# Patient Record
Sex: Female | Born: 1935 | Race: Black or African American | Hispanic: No | Marital: Married | Smoking: Never smoker
Health system: Southern US, Community
[De-identification: ages and names within clinical notes are randomized; demographics above are authoritative.]

## PROBLEM LIST (undated history)

## (undated) DIAGNOSIS — I10 Essential (primary) hypertension: Secondary | ICD-10-CM

## (undated) HISTORY — PX: TUBAL LIGATION: SHX77

---

## 1999-05-31 ENCOUNTER — Emergency Department (HOSPITAL_COMMUNITY): Admission: EM | Admit: 1999-05-31 | Discharge: 1999-05-31 | Payer: Self-pay | Admitting: Emergency Medicine

## 2000-04-10 ENCOUNTER — Encounter: Payer: Self-pay | Admitting: Internal Medicine

## 2000-04-10 ENCOUNTER — Encounter: Admission: RE | Admit: 2000-04-10 | Discharge: 2000-04-10 | Payer: Self-pay | Admitting: Internal Medicine

## 2000-05-29 ENCOUNTER — Other Ambulatory Visit: Admission: RE | Admit: 2000-05-29 | Discharge: 2000-05-29 | Payer: Self-pay | Admitting: Obstetrics & Gynecology

## 2000-05-29 ENCOUNTER — Encounter (INDEPENDENT_AMBULATORY_CARE_PROVIDER_SITE_OTHER): Payer: Self-pay

## 2000-06-03 ENCOUNTER — Encounter: Admission: RE | Admit: 2000-06-03 | Discharge: 2000-06-03 | Payer: Self-pay | Admitting: Internal Medicine

## 2000-06-03 ENCOUNTER — Encounter: Payer: Self-pay | Admitting: Internal Medicine

## 2003-02-07 ENCOUNTER — Other Ambulatory Visit: Admission: RE | Admit: 2003-02-07 | Discharge: 2003-02-07 | Payer: Self-pay | Admitting: Internal Medicine

## 2003-06-13 ENCOUNTER — Ambulatory Visit (HOSPITAL_COMMUNITY): Admission: RE | Admit: 2003-06-13 | Discharge: 2003-06-13 | Payer: Self-pay | Admitting: Internal Medicine

## 2005-03-26 ENCOUNTER — Encounter: Admission: RE | Admit: 2005-03-26 | Discharge: 2005-03-26 | Payer: Self-pay | Admitting: Internal Medicine

## 2006-01-29 ENCOUNTER — Emergency Department (HOSPITAL_COMMUNITY): Admission: EM | Admit: 2006-01-29 | Discharge: 2006-01-29 | Payer: Self-pay | Admitting: Emergency Medicine

## 2007-05-27 ENCOUNTER — Emergency Department (HOSPITAL_COMMUNITY): Admission: EM | Admit: 2007-05-27 | Discharge: 2007-05-27 | Payer: Self-pay | Admitting: Emergency Medicine

## 2007-09-29 ENCOUNTER — Encounter: Admission: RE | Admit: 2007-09-29 | Discharge: 2007-09-29 | Payer: Self-pay | Admitting: Internal Medicine

## 2010-12-21 LAB — BASIC METABOLIC PANEL
BUN: 4 — ABNORMAL LOW
CO2: 30
Calcium: 9.4
Chloride: 99
Creatinine, Ser: 1.04
GFR calc Af Amer: >60

## 2010-12-21 LAB — URINALYSIS, ROUTINE W REFLEX MICROSCOPIC
Glucose, UA: NEGATIVE
Hgb urine dipstick: NEGATIVE
Ketones, ur: NEGATIVE
Protein, ur: NEGATIVE
Urobilinogen, UA: 0.2

## 2010-12-21 LAB — CBC
MCHC: 34.7
MCV: 86.7
Platelets: 154
RBC: 4.49

## 2010-12-21 LAB — DIFFERENTIAL
Basophils Absolute: 0
Basophils Relative: 0
Eosinophils Absolute: 0
Neutro Abs: 4.9
Neutrophils Relative %: 91 — ABNORMAL HIGH

## 2011-08-29 ENCOUNTER — Ambulatory Visit
Admission: RE | Admit: 2011-08-29 | Discharge: 2011-08-29 | Disposition: A | Discharge status: Home / Self Care | Payer: Medicare Other | Source: Ambulatory Visit | Attending: Orthopedic Surgery | Admitting: Orthopedic Surgery

## 2011-08-29 ENCOUNTER — Other Ambulatory Visit: Payer: Self-pay | Admitting: Orthopedic Surgery

## 2011-08-29 DIAGNOSIS — R52 Pain, unspecified: Secondary | ICD-10-CM

## 2011-08-29 DIAGNOSIS — M5412 Radiculopathy, cervical region: Secondary | ICD-10-CM

## 2012-08-13 ENCOUNTER — Encounter (HOSPITAL_COMMUNITY): Payer: Self-pay | Admitting: Emergency Medicine

## 2012-08-13 ENCOUNTER — Emergency Department (HOSPITAL_COMMUNITY): Payer: Medicare Other

## 2012-08-13 ENCOUNTER — Emergency Department (HOSPITAL_COMMUNITY)
Admission: EM | Admit: 2012-08-13 | Discharge: 2012-08-13 | Disposition: A | Discharge status: Home / Self Care | Payer: Medicare Other | Attending: Emergency Medicine | Admitting: Emergency Medicine

## 2012-08-13 DIAGNOSIS — Z79899 Other long term (current) drug therapy: Secondary | ICD-10-CM | POA: Insufficient documentation

## 2012-08-13 DIAGNOSIS — W010XXA Fall on same level from slipping, tripping and stumbling without subsequent striking against object, initial encounter: Secondary | ICD-10-CM | POA: Insufficient documentation

## 2012-08-13 DIAGNOSIS — S8392XA Sprain of unspecified site of left knee, initial encounter: Secondary | ICD-10-CM

## 2012-08-13 DIAGNOSIS — Y939 Activity, unspecified: Secondary | ICD-10-CM | POA: Insufficient documentation

## 2012-08-13 DIAGNOSIS — Y9289 Other specified places as the place of occurrence of the external cause: Secondary | ICD-10-CM | POA: Insufficient documentation

## 2012-08-13 DIAGNOSIS — IMO0002 Reserved for concepts with insufficient information to code with codable children: Secondary | ICD-10-CM | POA: Insufficient documentation

## 2012-08-13 DIAGNOSIS — I10 Essential (primary) hypertension: Secondary | ICD-10-CM | POA: Insufficient documentation

## 2012-08-13 HISTORY — DX: Essential (primary) hypertension: I10

## 2012-08-13 MED ORDER — TRAMADOL HCL 50 MG PO TABS: 50.0000 mg | ORAL_TABLET | Freq: Four times a day (QID) | ORAL | Status: DC | PRN

## 2012-08-13 NOTE — ED Provider Notes (Signed)
History     CSN: [id number]  Arrival date & time 08/13/12  7846   First MD Initiated Contact with Patient 08/13/12 1947      Chief Complaint  Patient presents with  . Knee Pain    (Consider location/radiation/quality/duration/timing/severity/associated sxs/prior treatment) HPI Comments: Patient presents to the ER for evaluation of left knee injury. Patient reports that she slipped on the wet floor earlier today and fell. She twisted her left knee. She has been having pain in the knee since the fall. She has been icing it and there is no swelling. She is able to walk, but it hurts when she puts weight on it. No other injury. Did no hit her head.  Patient is a 77 y.o. female presenting with knee pain.  Knee Pain Associated symptoms: no back pain and no neck pain     Past Medical History  Diagnosis Date  . Hypertension     Past Surgical History  Procedure Laterality Date  . Tubal ligation      Family History  Problem Relation Age of Onset  . Hypertension Mother   . Diabetes Mother   . Diabetes Sister     History  Substance Use Topics  . Smoking status: Never Smoker   . Smokeless tobacco: Not on file  . Alcohol Use: No    OB History   Grav Para Term Preterm Abortions TAB SAB Ect Mult Living                  Review of Systems  HENT: Negative for neck pain.   Musculoskeletal: Positive for arthralgias. Negative for back pain.    Allergies  Penicillins  Home Medications   Current Outpatient Rx  Name  Route  Sig  Dispense  Refill  . ibuprofen (ADVIL,MOTRIN) 200 MG tablet   Oral   Take 200 mg by mouth every 6 (six) hours as needed for pain.         Marland Kitchen triamterene-hydrochlorothiazide (MAXZIDE-25) 37.5-25 MG per tablet   Oral   Take 1 tablet by mouth daily.           BP 152/68  Pulse 50  Temp(Src) 98.8 F (37.1 C) (Oral)  Resp 16  SpO2 100%  Physical Exam  Constitutional: She appears well-developed and well-nourished.  HENT:  Head:  Normocephalic.  Eyes: Pupils are equal, round, and reactive to light.  Neck: Normal range of motion. No spinous process tenderness and no muscular tenderness present.  Musculoskeletal: Normal range of motion.       Left knee: She exhibits normal range of motion, no swelling, no effusion, no ecchymosis, no deformity, no laceration, no erythema, normal alignment, no LCL laxity, normal patellar mobility and no MCL laxity. Tenderness found.       Cervical back: Normal.       Thoracic back: Normal.       Lumbar back: Normal.    ED Course  Procedures (including critical care time)  Labs Reviewed - No data to display Dg Knee Complete 4 Views Left  08/13/2012   *RADIOLOGY REPORT*  Clinical Data: Fall, left knee pain.  LEFT KNEE - COMPLETE 4+ VIEW  Comparison: None.  Findings: There is no acute bony or joint abnormality.  No joint effusion.  The patient has some degenerative change about the knee most notable in the medial compartment.  Enthesopathic change quadriceps tendon insertion noted.  IMPRESSION: No acute finding.   Original Report Authenticated By: Holley Dexter, M.D.  Diagnosis: Left knee strain    MDM  Patient comes to the ER for evaluation of left knee injury. Patient earlier today. He has pain in her left knee with walking, but examination was unremarkable. No abrasion, swelling, effusion. Range of motion is normal. No ligamentous instability. X-ray was negative for fracture or dislocation. Patient reassured, continue rest, ice and elevation.        Gilda Crease, MD 08/13/12 2036

## 2012-08-13 NOTE — ED Notes (Signed)
Pt states she slipped in a puddle of water at the [facility] causing her to fall this morning around 10am  Pt states her left knee is painful and stiff  Pt states it is not swollen but she has had ice on it today

## 2019-02-13 ENCOUNTER — Other Ambulatory Visit: Payer: Self-pay

## 2019-02-13 ENCOUNTER — Emergency Department (HOSPITAL_COMMUNITY): Payer: Medicare HMO

## 2019-02-13 ENCOUNTER — Emergency Department (HOSPITAL_COMMUNITY)
Admission: EM | Admit: 2019-02-13 | Discharge: 2019-02-13 | Disposition: A | Discharge status: Home / Self Care | Payer: Medicare HMO | Attending: Emergency Medicine | Admitting: Emergency Medicine

## 2019-02-13 DIAGNOSIS — R0602 Shortness of breath: Secondary | ICD-10-CM | POA: Diagnosis not present

## 2019-02-13 DIAGNOSIS — Z7982 Long term (current) use of aspirin: Secondary | ICD-10-CM | POA: Diagnosis not present

## 2019-02-13 DIAGNOSIS — J189 Pneumonia, unspecified organism: Secondary | ICD-10-CM | POA: Diagnosis not present

## 2019-02-13 DIAGNOSIS — U071 COVID-19: Secondary | ICD-10-CM | POA: Diagnosis not present

## 2019-02-13 DIAGNOSIS — R519 Headache, unspecified: Secondary | ICD-10-CM | POA: Diagnosis present

## 2019-02-13 DIAGNOSIS — Z79899 Other long term (current) drug therapy: Secondary | ICD-10-CM | POA: Insufficient documentation

## 2019-02-13 DIAGNOSIS — I10 Essential (primary) hypertension: Secondary | ICD-10-CM | POA: Diagnosis not present

## 2019-02-13 LAB — CBC
HCT: 44.5 % (ref 36.0–46.0)
Hemoglobin: 14.2 g/dL (ref 12.0–15.0)
MCH: 28.7 pg (ref 26.0–34.0)
MCHC: 31.9 g/dL (ref 30.0–36.0)
MCV: 89.9 fL (ref 80.0–100.0)
Platelets: 388 10*3/uL (ref 150–400)
RBC: 4.95 MIL/uL (ref 3.87–5.11)
RDW: 12.5 % (ref 11.5–15.5)
WBC: 15.6 10*3/uL — ABNORMAL HIGH (ref 4.0–10.5)
nRBC: 0 % (ref 0.0–0.2)

## 2019-02-13 LAB — BASIC METABOLIC PANEL
Anion gap: 15 (ref 5–15)
BUN: 8 mg/dL (ref 8–23)
CO2: 28 mmol/L (ref 22–32)
Calcium: 9.7 mg/dL (ref 8.9–10.3)
Chloride: 93 mmol/L — ABNORMAL LOW (ref 98–111)
Creatinine, Ser: 1.06 mg/dL — ABNORMAL HIGH (ref 0.44–1.00)
GFR calc Af Amer: 57 mL/min — ABNORMAL LOW (ref >60)
GFR calc non Af Amer: 49 mL/min — ABNORMAL LOW (ref >60)
Glucose, Bld: 165 mg/dL — ABNORMAL HIGH (ref 70–99)
Potassium: 4.1 mmol/L (ref 3.5–5.1)
Sodium: 136 mmol/L (ref 135–145)

## 2019-02-13 LAB — TROPONIN I (HIGH SENSITIVITY)
Troponin I (High Sensitivity): 6 ng/L (ref <18)
Troponin I (High Sensitivity): 5 ng/L (ref <18)

## 2019-02-13 LAB — SEDIMENTATION RATE: Sed Rate: 37 mm/hr — ABNORMAL HIGH (ref 0–22)

## 2019-02-13 MED ORDER — SODIUM CHLORIDE 0.9% FLUSH
3.0000 mL | Freq: Once | INTRAVENOUS | Status: AC
  Administered 2019-02-13: 3 mL via INTRAVENOUS

## 2019-02-13 MED ORDER — AZITHROMYCIN 250 MG PO TABS
500.0000 mg | ORAL_TABLET | Freq: Once | ORAL | Status: AC
  Administered 2019-02-13: 18:00:00 500 mg via ORAL
  Filled 2019-02-13: qty 2

## 2019-02-13 MED ORDER — IOHEXOL 350 MG/ML SOLN
100.0000 mL | Freq: Once | INTRAVENOUS | Status: AC | PRN
  Administered 2019-02-13: 17:00:00 51 mL via INTRAVENOUS

## 2019-02-13 MED ORDER — ONDANSETRON HCL 4 MG/2ML IJ SOLN
4.0000 mg | Freq: Once | INTRAMUSCULAR | Status: AC
  Administered 2019-02-13: 4 mg via INTRAVENOUS
  Filled 2019-02-13: qty 2

## 2019-02-13 MED ORDER — AZITHROMYCIN 250 MG PO TABS: 250.0000 mg | ORAL_TABLET | Freq: Every day | ORAL | 0 refills | Status: AC

## 2019-02-13 NOTE — ED Provider Notes (Addendum)
[place] EMERGENCY DEPARTMENT Provider Note   CSN: [id number] Arrival date & time: 02/13/19  1349     History   Chief Complaint Chief Complaint  Patient presents with   Headache   Shortness of Breath    HPI Erika Clements is a 83 y.o. female.  HPI   Patient presents today for a headache and shortness of breath.  Both symptoms have been present for over a year since October 2019, and she states that they are intermittent but worse over the last 1 month.  She states that her headache got so bad last night it was 10 out of 10, but it is currently 1 out of 10 without intervention.  She states the headache started in the back of her head and moved forward, described as a sharp sensation.  She denies any nausea, photophobia, aversion to loud noises.  Her shortness of breath is worse with exertion, with a dry cough.  She denies any sick contacts, she states her symptoms have been worse since the death of her grandson 2 months ago.  She denies any fevers, chills, abdominal pain, nausea, emesis.  Nothing seems to make her symptoms better besides time, nothing seems to make it worse beyond exertion.  Past Medical History:  Diagnosis Date   Hypertension     There are no active problems to display for this patient.   Past Surgical History:  Procedure Laterality Date   TUBAL LIGATION       OB History   No obstetric history on file.      Home Medications    Prior to Admission medications   Medication Sig Start Date End Date Taking? Authorizing Provider  aspirin EC 81 MG tablet Take 81 mg by mouth daily.   Yes [provider]  triamterene-hydrochlorothiazide (MAXZIDE-25) 37.5-25 MG per tablet Take 1 tablet by mouth daily.   Yes [provider]  azithromycin (ZITHROMAX) 250 MG tablet Take 1 tablet (250 mg total) by mouth daily for 4 doses. Take first 2 tablets together, then 1 every day until finished. 02/13/19 02/17/19  Julianne Rice, MD    Family History Family History  Problem Relation Age of Onset   Hypertension Mother    Diabetes Mother    Diabetes Sister     Social History Social History   Tobacco Use   Smoking status: Never Smoker  Substance Use Topics   Alcohol use: No   Drug use: No     Allergies   Penicillins   Review of Systems Review of Systems  Constitutional: Negative for fever.  Eyes: Negative for photophobia and visual disturbance.  Respiratory: Positive for cough and shortness of breath.   Gastrointestinal: Negative for nausea and vomiting.  Skin: Negative for rash and wound.  Neurological: Positive for headaches. Negative for dizziness, syncope and weakness.  All other systems reviewed and are negative.    Physical Exam Updated Vital Signs BP 120/72 (BP Location: Right Arm)    Pulse 80    Temp 99 F (37.2 C) (Oral)    Resp 19    Ht '5\' 4"'  (1.626 m)    Wt 76.7 kg    SpO2 100%    BMI 29.01 kg/m   Physical Exam Vitals signs and nursing note reviewed.  Constitutional:      Appearance: She is well-developed. She is not ill-appearing.  HENT:     Head: Normocephalic and atraumatic.  Eyes:     General: No visual field deficit.  Conjunctiva/sclera: Conjunctivae normal.  Neck:     Musculoskeletal: Neck supple.  Cardiovascular:     Rate and Rhythm: Normal rate and regular rhythm.  Pulmonary:     Effort: Pulmonary effort is normal. No respiratory distress.     Breath sounds: Normal breath sounds.     Comments: Clear bilaterally to auscultation Abdominal:     Palpations: Abdomen is soft.     Tenderness: There is no abdominal tenderness.  Musculoskeletal: Normal range of motion.        General: No swelling.     Comments: No bilateral lower extremity swelling, edema, asymmetry  Skin:    General: Skin is warm and dry.     Capillary Refill: Capillary refill takes less than 2 seconds.  Neurological:     Mental Status: She is alert and oriented to person,  place, and time.     GCS: GCS eye subscore is 4. GCS verbal subscore is 5. GCS motor subscore is 6.     Cranial Nerves: No cranial nerve deficit, dysarthria or facial asymmetry.     Motor: No weakness.     Comments: Normal neurologic exam  Psychiatric:        Mood and Affect: Mood normal.        Behavior: Behavior normal.      ED Treatments / Results  Labs (all labs ordered are listed, but only abnormal results are displayed) Labs Reviewed  BASIC METABOLIC PANEL - Abnormal; Notable for the following components:      Result Value   Chloride 93 (*)    Glucose, Bld 165 (*)    Creatinine, Ser 1.06 (*)    GFR calc non Af Amer 49 (*)    GFR calc Af Amer 57 (*)    All other components within normal limits  CBC - Abnormal; Notable for the following components:   WBC 15.6 (*)    All other components within normal limits  SEDIMENTATION RATE - Abnormal; Notable for the following components:   Sed Rate 37 (*)    All other components within normal limits  SARS CORONAVIRUS 2 (TAT 6-24 HRS)  TROPONIN I (HIGH SENSITIVITY)  TROPONIN I (HIGH SENSITIVITY)    EKG EKG Interpretation  Date/Time:  Saturday February 13 2019 14:01:03 EST Ventricular Rate:  108 PR Interval:  206 QRS Duration: 64 QT Interval:  318 QTC Calculation: 426 R Axis:   79 Text Interpretation: Sinus tachycardia Nonspecific ST abnormality Abnormal ECG rate faster than previous Confirmed by Theotis Burrow [phone]) on 02/13/2019 4:05:10 PM   Radiology Dg Chest 2 View  Result Date: 02/13/2019 CLINICAL DATA:  Shortness of breath, productive cough EXAM: CHEST - 2 VIEW COMPARISON:  05/27/2007 FINDINGS: Heart size is stable. Mild aortic ectasia similar to prior study. Subtle increased interstitial markings at the lung bases left greater than right no signs of pleural effusion. No dense consolidation. No acute bone finding. IMPRESSION: Subtle increased interstitial markings at the lung bases left greater than right. This may  represent pneumonitis/developing pneumonia or chronic interstitial thickening that has developed since 2009. Electronically Signed   By: Zetta Bills M.D.   On: 02/13/2019 14:40   Ct Head Wo Contrast  Result Date: 02/13/2019 CLINICAL DATA:  Headache for 2 days. Shortness of breath for 3 days. EXAM: CT HEAD WITHOUT CONTRAST TECHNIQUE: Contiguous axial images were obtained from the base of the skull through the vertex without intravenous contrast. COMPARISON:  None. FINDINGS: Brain: Faint calcification of the globus pallidus and dentate nuclei  observed. There are numerous possible causes although idiopathic causes are most common. Periventricular white matter and corona radiata hypodensities favor chronic ischemic microvascular white matter disease. Otherwise the brainstem, cerebellum, cerebral peduncles, thalami, basal ganglia, basilar cisterns, Otherwise, the brainstem, cerebellum, cerebral peduncles, thalamus, basal ganglia, basilar cisterns, and ventricular system appear within normal limits. No intracranial hemorrhage, mass lesion, or acute CVA. Vascular: There is atherosclerotic calcification of the cavernous carotid arteries bilaterally. Skull: Unremarkable Sinuses/Orbits: Unremarkable Other: No supplemental non-categorized findings. IMPRESSION: 1. No acute intracranial findings. 2. Periventricular white matter and corona radiata hypodensities favor chronic ischemic microvascular white matter disease. 3. Atherosclerosis. 4. Symmetric mild calcifications in the globus pallidus and dentate nuclei, most commonly idiopathic/physiologic. Electronically Signed   By: Van Clines M.D.   On: 02/13/2019 17:05   Ct Angio Chest Pe W And/or Wo Contrast  Result Date: 02/13/2019 CLINICAL DATA:  Shortness of breath for 3 days EXAM: CT ANGIOGRAPHY CHEST WITH CONTRAST TECHNIQUE: Multidetector CT imaging of the chest was performed using the standard protocol during bolus administration of intravenous contrast.  Multiplanar CT image reconstructions and MIPs were obtained to evaluate the vascular anatomy. CONTRAST:  51m OMNIPAQUE IOHEXOL 350 MG/ML SOLN COMPARISON:  Chest radiograph 02/13/2019 FINDINGS: Cardiovascular: No filling defect is identified in the pulmonary arterial tree to suggest pulmonary embolus. Coronary, thoracic aortic, and branch vessel atherosclerotic vascular disease. Mediastinum/Nodes: Unremarkable Lungs/Pleura: Centrilobular emphysema. Old granulomatous disease. Mild cylindrical bronchiectasis in both lower lobes. Mild bandlike and ground-glass opacities in both lower lobes. Upper Abdomen: Unremarkable Musculoskeletal: Thoracic spondylosis. Spurring causes suspected mild bilateral foraminal stenosis at T11-12. Review of the MIP images confirms the above findings. IMPRESSION: 1. No filling defect is identified in the pulmonary arterial tree to suggest pulmonary embolus. 2. Coronary, thoracic aortic, and branch vessel atherosclerotic vascular disease. 3. Centrilobular emphysema. 4. Mild cylindrical bronchiectasis in both lower lobes. 5. Mild bandlike and ground-glass opacities in both lower lobes, favor atelectasis or scarring although a component of low-grade atypical infection is not excluded. 6. Spurring causes suspected mild bilateral foraminal stenosis at T11-12. Emphysema (ICD10-J43.9). Electronically Signed   By: WVan ClinesM.D.   On: 02/13/2019 17:11    Procedures Procedures (including critical care time)  Medications Ordered in ED Medications  sodium chloride flush (NS) 0.9 % injection 3 mL (3 mLs Intravenous Given 02/13/19 1842)  iohexol (OMNIPAQUE) 350 MG/ML injection 100 mL (51 mLs Intravenous Contrast Given 02/13/19 1646)  azithromycin (ZITHROMAX) tablet 500 mg (500 mg Oral Given 02/13/19 1742)  ondansetron (ZOFRAN) injection 4 mg (4 mg Intravenous Given 02/13/19 1841)     Initial Impression / Assessment and Plan / ED Course  I have reviewed the triage vital signs and  the nursing notes.  Pertinent labs & imaging results that were available during my care of the patient were reviewed by me and considered in my medical decision making (see chart for details).     HEAR Score: 3  Erika SPRADLINis a 83y.o. female with a past medical history of hypertension presents today for chronic headache has been present for the last 14 months, intermittent and especially worse last night mostly resolved, as well as shortness of breath that has been acutely worse over the last 1 month but has also been present for over a year.  Patient recently restarted her hypertensive medications after not taking them for several months.  Labs and imaging ordered for differential diagnosis of intracranial bleed, temporal arteritis, electrolyte abnormality, anemia, sepsis, pneumonia.  Presentation is not  consistent with ACS, but due to complaints of shortness of breath, delta troponin ordered in triage.  ESR was less than 50, doubt temporal arteritis.  Troponin was normal, mild leukocytosis present, no significant electrolyte abnormality, anemia present.  Chest x-ray showed concern for atypical pneumonia, PE study ordered due to pneumonia picture as well as tachycardia.  Multifocal pneumonia present on CT scan, but no thromboembolic disease.  Patient is given antibiotics.  Follow-up recommended with your PCP.  Covid swab pending.  Care of patient discussed with the supervising attending.   Final Clinical Impressions(s) / ED Diagnoses   Final diagnoses:  Atypical pneumonia  Shortness of breath  Chronic nonintractable headache, unspecified headache type    ED Discharge Orders         Ordered    azithromycin (ZITHROMAX) 250 MG tablet  Daily     02/13/19 2244             Julianne Rice, MD 02/13/19 2358    Little, Wenda Overland, MD 02/14/19 [phone]

## 2019-02-13 NOTE — ED Notes (Signed)
Pt placed on 2L [place] due to O2 sat dropping during sleep to 89%

## 2019-02-13 NOTE — ED Triage Notes (Signed)
Pt to ED reports headache and SHOB X 3 months. Reports productive cough.

## 2019-02-13 NOTE — Discharge Instructions (Addendum)
You appear to have a pneumonia. We are testing for COVID. Please take your antibiotic as prescribed in case this is not a viral pneumonia and instead a bacterial pneumonia.  Please follow-up with your PCP as needed.  Please continue your hypertension medications as this will also help future headaches.  Coronavirus (COVID-19) Are you at risk?  Are you at risk for the Coronavirus (COVID-19)?  To be considered HIGH RISK for Coronavirus (COVID-19), you have to meet the following criteria:  Traveled to [place], [place], [place], [place] or [place]; or in the [place] to [place], [place], [place], or [place]; and have fever, cough, and shortness of breath within the last 2 weeks of travel OR Been in close contact with a person diagnosed with COVID-19 within the last 2 weeks and have fever, cough, and shortness of breath IF YOU DO NOT MEET THESE CRITERIA, YOU ARE CONSIDERED LOW RISK FOR COVID-19.  What to do if you are HIGH RISK for COVID-19?  If you are having a medical emergency, call 911. Seek medical care right away. Before you go to a doctors office, urgent care or emergency department, call ahead and tell them about your recent travel, contact with someone diagnosed with COVID-19, and your symptoms. You should receive instructions from your physicians office regarding next steps of care.  When you arrive at healthcare provider, tell the healthcare staff immediately you have returned from visiting [place], [place], [place], [place] or [place]; or traveled in the [place] to [place], [place], [place], or [place]; in the last two weeks or you have been in close contact with a person diagnosed with COVID-19 in the last 2 weeks.   Tell the health care staff about your symptoms: fever, cough and shortness of breath. After you have been seen by a medical provider, you will be either: Tested for (COVID-19) and discharged home on quarantine except to seek medical care if symptoms  worsen, and asked to  Stay home and avoid contact with others until you get your results (4-5 days)  Avoid travel on public transportation if possible (such as bus, train, or airplane) or Sent to the Emergency Department by EMS for evaluation, COVID-19 testing, and possible admission depending on your condition and test results.  What to do if you are LOW RISK for COVID-19?  Reduce your risk of any infection by using the same precautions used for avoiding the common cold or flu:  Wash your hands often with soap and warm water for at least 20 seconds.  If soap and water are not readily available, use an alcohol-based hand sanitizer with at least 60% alcohol.  If coughing or sneezing, cover your mouth and nose by coughing or sneezing into the elbow areas of your shirt or coat, into a tissue or into your sleeve (not your hands). Avoid shaking hands with others and consider head nods or verbal greetings only. Avoid touching your eyes, nose, or mouth with unwashed hands.  Avoid close contact with people who are sick. Avoid places or events with large numbers of people in one location, like concerts or sporting events. Carefully consider travel plans you have or are making. If you are planning any travel outside or inside the [place], visit the [place] webpage for the latest health notices. If you have some symptoms but not all symptoms, continue to monitor at home and seek medical attention if your symptoms worsen. If you are having a medical emergency, call 911.  ADDITIONAL HEALTHCARE OPTIONS FOR PATIENTS  [facility] / e-Visit: [email or web address]         [facility]: [phone]  [facility]: [phone]                   [facility]: [phone]

## 2019-02-14 LAB — SARS CORONAVIRUS 2 (TAT 6-24 HRS): SARS Coronavirus 2: POSITIVE — AB

## 2019-12-01 ENCOUNTER — Other Ambulatory Visit: Payer: Self-pay | Admitting: Student

## 2019-12-01 ENCOUNTER — Other Ambulatory Visit: Payer: Self-pay

## 2019-12-01 ENCOUNTER — Ambulatory Visit
Admission: RE | Admit: 2019-12-01 | Discharge: 2019-12-01 | Disposition: A | Discharge status: Home / Self Care | Payer: Medicare HMO | Source: Ambulatory Visit | Attending: Student | Admitting: Student

## 2019-12-01 DIAGNOSIS — J439 Emphysema, unspecified: Secondary | ICD-10-CM

## 2019-12-01 DIAGNOSIS — R52 Pain, unspecified: Secondary | ICD-10-CM

## 2020-05-10 ENCOUNTER — Other Ambulatory Visit: Payer: Self-pay | Admitting: Student

## 2020-05-10 DIAGNOSIS — Z1231 Encounter for screening mammogram for malignant neoplasm of breast: Secondary | ICD-10-CM

## 2020-06-29 ENCOUNTER — Other Ambulatory Visit: Payer: Self-pay

## 2020-06-29 ENCOUNTER — Ambulatory Visit
Admission: RE | Admit: 2020-06-29 | Discharge: 2020-06-29 | Disposition: A | Discharge status: Home / Self Care | Payer: Medicare HMO | Source: Ambulatory Visit | Attending: Student | Admitting: Student

## 2020-06-29 DIAGNOSIS — Z1231 Encounter for screening mammogram for malignant neoplasm of breast: Secondary | ICD-10-CM

## 2021-05-21 ENCOUNTER — Emergency Department (HOSPITAL_COMMUNITY): Payer: Medicare HMO

## 2021-05-21 ENCOUNTER — Encounter (HOSPITAL_COMMUNITY): Payer: Self-pay

## 2021-05-21 ENCOUNTER — Other Ambulatory Visit: Payer: Self-pay

## 2021-05-21 ENCOUNTER — Emergency Department (HOSPITAL_COMMUNITY)
Admission: EM | Admit: 2021-05-21 | Discharge: 2021-05-21 | Disposition: A | Discharge status: Home / Self Care | Payer: Medicare HMO | Attending: Emergency Medicine | Admitting: Emergency Medicine

## 2021-05-21 DIAGNOSIS — Z20822 Contact with and (suspected) exposure to covid-19: Secondary | ICD-10-CM | POA: Insufficient documentation

## 2021-05-21 DIAGNOSIS — Z79899 Other long term (current) drug therapy: Secondary | ICD-10-CM | POA: Insufficient documentation

## 2021-05-21 DIAGNOSIS — R1084 Generalized abdominal pain: Secondary | ICD-10-CM | POA: Diagnosis not present

## 2021-05-21 DIAGNOSIS — R112 Nausea with vomiting, unspecified: Secondary | ICD-10-CM | POA: Diagnosis not present

## 2021-05-21 DIAGNOSIS — R197 Diarrhea, unspecified: Secondary | ICD-10-CM | POA: Diagnosis not present

## 2021-05-21 DIAGNOSIS — R9431 Abnormal electrocardiogram [ECG] [EKG]: Secondary | ICD-10-CM

## 2021-05-21 LAB — URINALYSIS, ROUTINE W REFLEX MICROSCOPIC
Bacteria, UA: NONE SEEN
Bilirubin Urine: NEGATIVE
Glucose, UA: >=500 mg/dL — AB
Hgb urine dipstick: NEGATIVE
Ketones, ur: NEGATIVE mg/dL
Leukocytes,Ua: NEGATIVE
Nitrite: NEGATIVE
Protein, ur: NEGATIVE mg/dL
Specific Gravity, Urine: 1.026 (ref 1.005–1.030)
pH: 5 (ref 5.0–8.0)

## 2021-05-21 LAB — CBC WITH DIFFERENTIAL/PLATELET
Abs Immature Granulocytes: 0.12 10*3/uL — ABNORMAL HIGH (ref 0.00–0.07)
Basophils Absolute: 0 10*3/uL (ref 0.0–0.1)
Basophils Relative: 0 %
Eosinophils Absolute: 0 10*3/uL (ref 0.0–0.5)
Eosinophils Relative: 0 %
HCT: 45.8 % (ref 36.0–46.0)
Hemoglobin: 14.5 g/dL (ref 12.0–15.0)
Immature Granulocytes: 1 %
Lymphocytes Relative: 6 %
Lymphs Abs: 0.9 10*3/uL (ref 0.7–4.0)
MCH: 28.5 pg (ref 26.0–34.0)
MCHC: 31.7 g/dL (ref 30.0–36.0)
MCV: 90.2 fL (ref 80.0–100.0)
Monocytes Absolute: 0.6 10*3/uL (ref 0.1–1.0)
Monocytes Relative: 4 %
Neutro Abs: 13.5 10*3/uL — ABNORMAL HIGH (ref 1.7–7.7)
Neutrophils Relative %: 89 %
Platelets: 259 10*3/uL (ref 150–400)
RBC: 5.08 MIL/uL (ref 3.87–5.11)
RDW: 13.7 % (ref 11.5–15.5)
WBC: 15.1 10*3/uL — ABNORMAL HIGH (ref 4.0–10.5)
nRBC: 0 % (ref 0.0–0.2)

## 2021-05-21 LAB — COMPREHENSIVE METABOLIC PANEL
ALT: 23 U/L (ref 0–44)
AST: 24 U/L (ref 15–41)
Albumin: 4.4 g/dL (ref 3.5–5.0)
Alkaline Phosphatase: 88 U/L (ref 38–126)
Anion gap: 6 (ref 5–15)
BUN: 19 mg/dL (ref 8–23)
CO2: 28 mmol/L (ref 22–32)
Calcium: 9.3 mg/dL (ref 8.9–10.3)
Chloride: 101 mmol/L (ref 98–111)
Creatinine, Ser: 0.89 mg/dL (ref 0.44–1.00)
GFR, Estimated: >60 mL/min (ref >60)
Glucose, Bld: 265 mg/dL — ABNORMAL HIGH (ref 70–99)
Potassium: 4.8 mmol/L (ref 3.5–5.1)
Sodium: 135 mmol/L (ref 135–145)
Total Bilirubin: 0.7 mg/dL (ref 0.3–1.2)
Total Protein: 8.3 g/dL — ABNORMAL HIGH (ref 6.5–8.1)

## 2021-05-21 LAB — BLOOD GAS, VENOUS
Acid-Base Excess: 3.8 mmol/L — ABNORMAL HIGH (ref 0.0–2.0)
Bicarbonate: 31.3 mmol/L — ABNORMAL HIGH (ref 20.0–28.0)
O2 Saturation: 43.2 %
Patient temperature: 37
pCO2, Ven: 58 mmHg (ref 44–60)
pH, Ven: 7.34 (ref 7.25–7.43)
pO2, Ven: 31 mmHg — CL (ref 32–45)

## 2021-05-21 LAB — RESP PANEL BY RT-PCR (FLU A&B, COVID) ARPGX2
Influenza A by PCR: NEGATIVE
Influenza B by PCR: NEGATIVE
SARS Coronavirus 2 by RT PCR: NEGATIVE

## 2021-05-21 LAB — LIPASE, BLOOD: Lipase: 31 U/L (ref 11–51)

## 2021-05-21 MED ORDER — SODIUM CHLORIDE 0.9 % IV BOLUS
500.0000 mL | Freq: Once | INTRAVENOUS | Status: AC
Start: 2021-05-21 — End: 2021-05-21
  Administered 2021-05-21: 500 mL via INTRAVENOUS

## 2021-05-21 MED ORDER — IOHEXOL 300 MG/ML  SOLN
100.0000 mL | Freq: Once | INTRAMUSCULAR | Status: AC | PRN
  Administered 2021-05-21: 100 mL via INTRAVENOUS

## 2021-05-21 MED ORDER — ONDANSETRON HCL 4 MG/2ML IJ SOLN
4.0000 mg | Freq: Once | INTRAMUSCULAR | Status: AC
  Administered 2021-05-21: 4 mg via INTRAVENOUS
  Filled 2021-05-21: qty 2

## 2021-05-21 MED ORDER — METOCLOPRAMIDE HCL 5 MG/ML IJ SOLN
10.0000 mg | Freq: Once | INTRAMUSCULAR | Status: DC
  Filled 2021-05-21: qty 2

## 2021-05-21 MED ORDER — MORPHINE SULFATE (PF) 4 MG/ML IV SOLN
4.0000 mg | Freq: Once | INTRAVENOUS | Status: DC
  Filled 2021-05-21: qty 1

## 2021-05-21 MED ORDER — MAGNESIUM SULFATE IN D5W 1-5 GM/100ML-% IV SOLN
1.0000 g | Freq: Once | INTRAVENOUS | Status: AC
  Administered 2021-05-21: 1 g via INTRAVENOUS
  Filled 2021-05-21: qty 100

## 2021-05-21 MED ORDER — SODIUM CHLORIDE (PF) 0.9 % IJ SOLN
INTRAMUSCULAR | Status: AC
  Filled 2021-05-21: qty 50

## 2021-05-21 NOTE — ED Notes (Signed)
Pt A&O x4. VSS. Attached to cardiac monitor x3. Pt cleaned up with linens changed by ED tech. Purewick placed and hooked to suction. Pt aware urine sample is necessary. Will continue to monitor.

## 2021-05-21 NOTE — ED Notes (Signed)
EDP notified pt has 22g peripheral IV hand access, despite ordered CT abd/pelvis. IV team consult placed at this time.

## 2021-05-21 NOTE — ED Triage Notes (Signed)
Pt. BIB [facility] from home c/o N/V/D x6 hrs. Last oral intake at 7pm.   EMS VS:  BP: 122/78 HR:88 O2: 96% RA CBG: 302 Temp: 97.2

## 2021-05-21 NOTE — ED Provider Notes (Signed)
Patient signed out to me is pending imaging.  CT abdomen pelvis unremarkable.  Patient is no longer vomiting but has some nausea.  However her QTc is prolonged at greater than 550.  Unclear cause as potassium appears normal calcium appears normal.  Magnesium 1 g given with minimal change in QTc.  Advised patient to avoid QT prolonging medications such as nausea medications.  Advised her to follow-up with her doctor within 1 week for repeat evaluation.  Advised immediate return for worsening symptoms or any additional concerns.   Cheryll Cockayne, MD 05/27/21 1252

## 2021-05-21 NOTE — ED Provider Notes (Signed)
[facility] DEPT Provider Note   CSN: [id number] Arrival date & time: 05/21/21  [id number]     History  Chief Complaint  Patient presents with   Emesis    Erika Clements is a 86 y.o. female.  Patient presents to the emergency department for evaluation of nausea and vomiting with abdominal pain.  Symptoms began around 9 PM.  Patient has had multiple episodes of nausea and vomiting, cannot hold anything down.  She tried Pepto-Bismol but vomited it back up.  She comes to the ER by ambulance.  During transport she has now developed diarrhea.      Home Medications Prior to Admission medications   Medication Sig Start Date End Date Taking? Authorizing Provider  albuterol (VENTOLIN HFA) 108 (90 Base) MCG/ACT inhaler Inhale 2 puffs into the lungs daily as needed for shortness of breath or wheezing. 01/19/21  Yes [provider]  amLODipine (NORVASC) 5 MG tablet Take 5 mg by mouth daily. 03/13/21  Yes [provider]  aspirin EC 81 MG tablet Take 81 mg by mouth daily.   Yes [provider]  atorvastatin (LIPITOR) 10 MG tablet Take 10 mg by mouth daily. 03/13/21  Yes [provider]  metFORMIN (GLUCOPHAGE) 500 MG tablet Take 500 mg by mouth daily. 11/23/20  Yes [provider]  SYMBICORT 160-4.5 MCG/ACT inhaler Inhale 2 puffs into the lungs daily as needed (for shortness of breath). 12/05/20  Yes [provider]  TRADJENTA 5 MG TABS tablet Take 5 mg by mouth daily. 04/20/21  Yes [provider]  triamterene-hydrochlorothiazide (MAXZIDE-25) 37.5-25 MG per tablet Take 1 tablet by mouth daily.   Yes [provider]      Allergies    Penicillins    Review of Systems   Review of Systems  Gastrointestinal:  Positive for diarrhea, nausea and vomiting.   Physical Exam Updated Vital Signs BP (!) 139/92    Pulse 76    Temp 97.7 F (36.5 C) (Oral)    Resp 15    Ht 5\' 4"  (1.626 m)    Wt 72.1  kg    SpO2 93%    BMI 27.29 kg/m  Physical Exam Vitals and nursing note reviewed.  Constitutional:      Appearance: She is well-developed.  HENT:     Head: Normocephalic and atraumatic.     Mouth/Throat:     Mouth: Mucous membranes are moist.  Eyes:     General: Vision grossly intact. Gaze aligned appropriately.     Extraocular Movements: Extraocular movements intact.     Conjunctiva/sclera: Conjunctivae normal.  Cardiovascular:     Rate and Rhythm: Normal rate and regular rhythm.     Pulses: Normal pulses.     Heart sounds: Normal heart sounds, S1 normal and S2 normal. No murmur heard.   No friction rub. No gallop.  Pulmonary:     Effort: Pulmonary effort is normal. No respiratory distress.     Breath sounds: Normal breath sounds.  Abdominal:     General: Bowel sounds are normal.     Palpations: Abdomen is soft.     Tenderness: There is generalized abdominal tenderness. There is no guarding or rebound.     Hernia: No hernia is present.  Musculoskeletal:        General: No swelling.     Cervical back: Full passive range of motion without pain, normal range of motion and neck supple. No spinous process tenderness or muscular tenderness. Normal range  of motion.     Right lower leg: No edema.     Left lower leg: No edema.  Skin:    General: Skin is warm and dry.     Capillary Refill: Capillary refill takes less than 2 seconds.     Findings: No ecchymosis, erythema, rash or wound.  Neurological:     General: No focal deficit present.     Mental Status: She is alert and oriented to person, place, and time.     GCS: GCS eye subscore is 4. GCS verbal subscore is 5. GCS motor subscore is 6.     Cranial Nerves: Cranial nerves 2-12 are intact.     Sensory: Sensation is intact.     Motor: Motor function is intact.     Coordination: Coordination is intact.  Psychiatric:        Attention and Perception: Attention normal.        Mood and Affect: Mood normal.        Speech: Speech  normal.        Behavior: Behavior normal.    ED Results / Procedures / Treatments   Labs (all labs ordered are listed, but only abnormal results are displayed) Labs Reviewed  CBC WITH DIFFERENTIAL/PLATELET - Abnormal; Notable for the following components:      Result Value   WBC 15.1 (*)    Neutro Abs 13.5 (*)    Abs Immature Granulocytes 0.12 (*)    All other components within normal limits  BLOOD GAS, VENOUS - Abnormal; Notable for the following components:   pO2, Ven 31 (*)    Bicarbonate 31.3 (*)    Acid-Base Excess 3.8 (*)    All other components within normal limits  RESP PANEL BY RT-PCR (FLU A&B, COVID) [id number]  COMPREHENSIVE METABOLIC PANEL  LIPASE, BLOOD    EKG EKG Interpretation  Date/Time:  Monday May 21 2021 03:46:04 EST Ventricular Rate:  76 PR Interval:  199 QRS Duration: 75 QT Interval:  521 QTC Calculation: 586 R Axis:   44 Text Interpretation: Sinus rhythm Borderline T abnormalities, anterior leads Prolonged QT interval Confirmed by Orpah Greek [phone] on 05/21/2021 5:24:17 AM  Radiology No results found.  Procedures Procedures    Medications Ordered in ED Medications  sodium chloride 0.9 % bolus 500 mL (500 mLs Intravenous New Bag/Given 05/21/21 0456)  ondansetron (ZOFRAN) injection 4 mg (4 mg Intravenous Given 05/21/21 0455)    ED Course/ Medical Decision Making/ A&P                           Medical Decision Making Amount and/or Complexity of Data Reviewed Labs: ordered.  Risk Prescription drug management.   Patient presents to ER with complaints of nausea, vomiting, diarrhea with abdominal pain.  Symptoms present for several hours at arrival.  Patient examination reveals diffuse tenderness.  No focality, no signs of acute surgical process or peritonitis.  Differential diagnosis is gastroenteritis, small bowel obstruction, diverticulitis, colitis.  Blood work initiated.  Will require CT scan for further evaluation.   Will signout oncoming ER physician to follow-up on results.        Final Clinical Impression(s) / ED Diagnoses Final diagnoses:  Nausea vomiting and diarrhea    Rx / DC Orders ED Discharge Orders     None         Cortavious Nix, Gwenyth Allegra, MD 05/21/21 [phone]

## 2021-05-21 NOTE — ED Notes (Signed)
Reviewed d/c instructions with pt and daughter

## 2021-05-21 NOTE — ED Notes (Addendum)
This nurse spoke with Erika Clements, with IV team, per Erika Clements, who contacted CT dept, pt's 22g hand IV adequate for CT abd/pelvis with contrast. This nurse verified the previous information with CT.

## 2021-05-21 NOTE — Progress Notes (Signed)
Per Ct staff, present PIV can be used for CT, RN taylor notified in th ED.

## 2021-05-21 NOTE — Discharge Instructions (Addendum)
Avoid any nausea medication such as Zofran or Phenergan or Reglan as this can prolong the EKG abnormality on your tests today called the QT segment.  Follow-up with your doctor within the week for repeat evaluation.  Return back to the ER if you have worsening symptoms or any additional concerns.

## 2021-05-21 NOTE — ED Notes (Signed)
Pt in CT at this time.

## 2021-05-21 NOTE — ED Notes (Signed)
Pt back from CT at this time 

## 2021-06-15 ENCOUNTER — Other Ambulatory Visit (HOSPITAL_COMMUNITY): Payer: Self-pay | Admitting: Student

## 2021-06-15 ENCOUNTER — Other Ambulatory Visit: Payer: Self-pay | Admitting: Student

## 2021-06-15 DIAGNOSIS — Z1231 Encounter for screening mammogram for malignant neoplasm of breast: Secondary | ICD-10-CM

## 2021-06-15 DIAGNOSIS — Z780 Asymptomatic menopausal state: Secondary | ICD-10-CM

## 2021-06-15 DIAGNOSIS — I10 Essential (primary) hypertension: Secondary | ICD-10-CM

## 2021-06-20 ENCOUNTER — Ambulatory Visit
Admission: RE | Admit: 2021-06-20 | Discharge: 2021-06-20 | Disposition: A | Discharge status: Home / Self Care | Payer: Medicare HMO | Source: Ambulatory Visit | Attending: Student | Admitting: Student

## 2021-06-20 DIAGNOSIS — Z780 Asymptomatic menopausal state: Secondary | ICD-10-CM

## 2021-06-25 ENCOUNTER — Other Ambulatory Visit: Payer: Self-pay

## 2021-06-25 ENCOUNTER — Ambulatory Visit (HOSPITAL_COMMUNITY)
Admission: RE | Admit: 2021-06-25 | Discharge: 2021-06-25 | Disposition: A | Discharge status: Home / Self Care | Payer: Medicare HMO | Source: Ambulatory Visit | Attending: Student | Admitting: Student

## 2021-06-25 DIAGNOSIS — I10 Essential (primary) hypertension: Secondary | ICD-10-CM | POA: Diagnosis not present

## 2021-06-25 DIAGNOSIS — I700 Atherosclerosis of aorta: Secondary | ICD-10-CM | POA: Insufficient documentation

## 2021-06-25 DIAGNOSIS — I358 Other nonrheumatic aortic valve disorders: Secondary | ICD-10-CM | POA: Diagnosis not present

## 2021-06-25 LAB — ECHOCARDIOGRAM COMPLETE
Area-P 1/2: 2.77 cm2
S' Lateral: 2.7 cm

## 2021-07-02 ENCOUNTER — Ambulatory Visit: Payer: Medicare HMO

## 2021-07-02 ENCOUNTER — Ambulatory Visit
Admission: RE | Admit: 2021-07-02 | Discharge: 2021-07-02 | Disposition: A | Discharge status: Home / Self Care | Payer: Medicare HMO | Source: Ambulatory Visit | Attending: Student | Admitting: Student

## 2021-07-02 DIAGNOSIS — Z1231 Encounter for screening mammogram for malignant neoplasm of breast: Secondary | ICD-10-CM

## 2022-06-07 ENCOUNTER — Other Ambulatory Visit: Payer: Self-pay | Admitting: Student

## 2022-06-07 DIAGNOSIS — Z1231 Encounter for screening mammogram for malignant neoplasm of breast: Secondary | ICD-10-CM

## 2022-07-23 ENCOUNTER — Ambulatory Visit
Admission: RE | Admit: 2022-07-23 | Discharge: 2022-07-23 | Disposition: A | Discharge status: Home / Self Care | Payer: Medicare HMO | Source: Ambulatory Visit | Attending: Student | Admitting: Student

## 2022-07-23 DIAGNOSIS — Z1231 Encounter for screening mammogram for malignant neoplasm of breast: Secondary | ICD-10-CM

## 2023-09-09 ENCOUNTER — Other Ambulatory Visit: Payer: Self-pay | Admitting: Student

## 2023-09-09 DIAGNOSIS — Z1231 Encounter for screening mammogram for malignant neoplasm of breast: Secondary | ICD-10-CM

## 2023-09-12 ENCOUNTER — Ambulatory Visit

## 2023-09-12 ENCOUNTER — Ambulatory Visit
Admission: RE | Admit: 2023-09-12 | Discharge: 2023-09-12 | Disposition: A | Discharge status: Home / Self Care | Source: Ambulatory Visit | Attending: Student | Admitting: Student

## 2023-09-12 DIAGNOSIS — Z1231 Encounter for screening mammogram for malignant neoplasm of breast: Secondary | ICD-10-CM

## 2023-11-21 IMAGING — MG MM DIGITAL SCREENING BILAT W/ TOMO AND CAD
8 series · 8 of 24 positions shown · non-contrast
Comparison: Previous exam(s).

CLINICAL DATA: Screening.

EXAM:
DIGITAL SCREENING BILATERAL MAMMOGRAM WITH TOMOSYNTHESIS AND CAD
TECHNIQUE: Bilateral screening digital craniocaudal and mediolateral oblique
mammograms were obtained. Bilateral screening digital breast
tomosynthesis was performed. The images were evaluated with
computer-aided detection.

[R CC synth-2D]
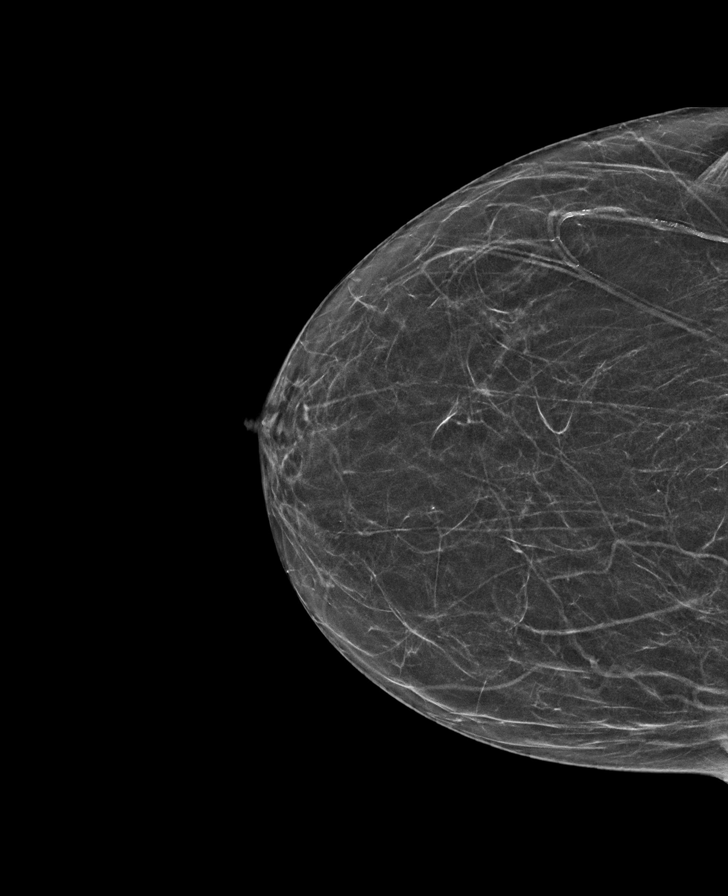

[L CC synth-2D]
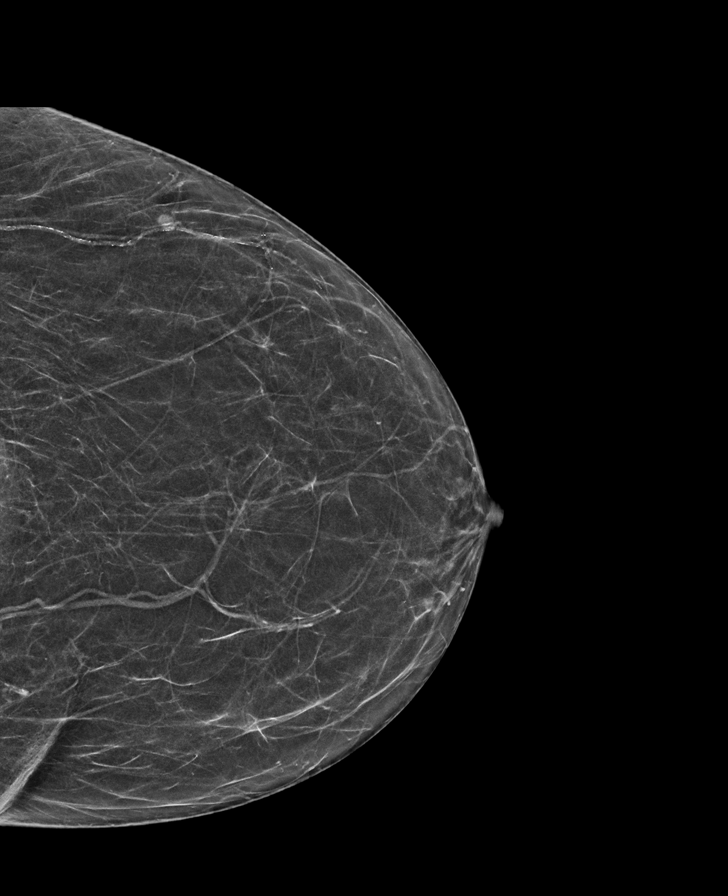

[L MLO synth-2D]
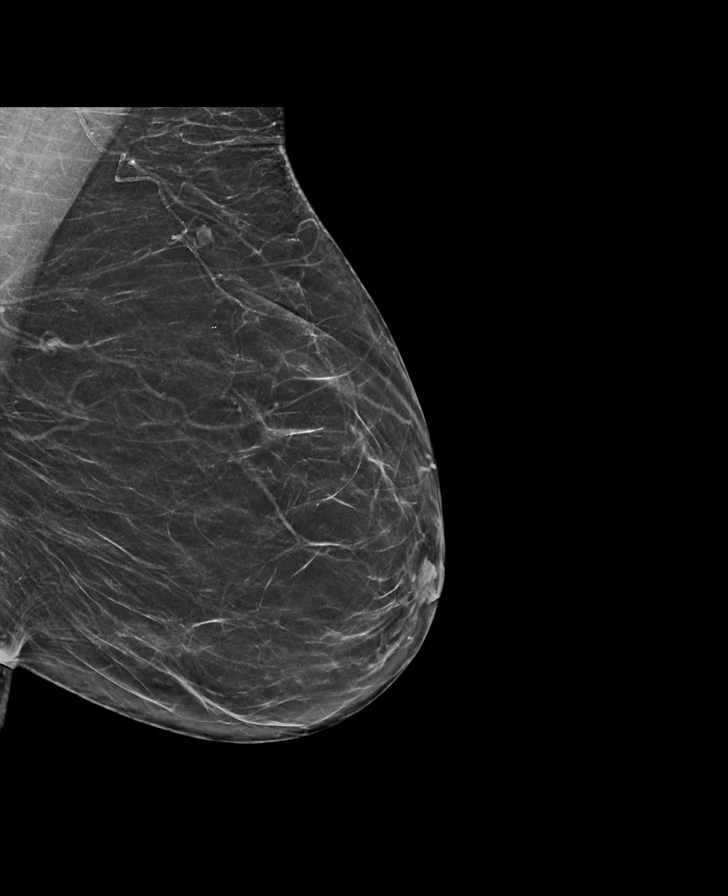

[R MLO synth-2D]
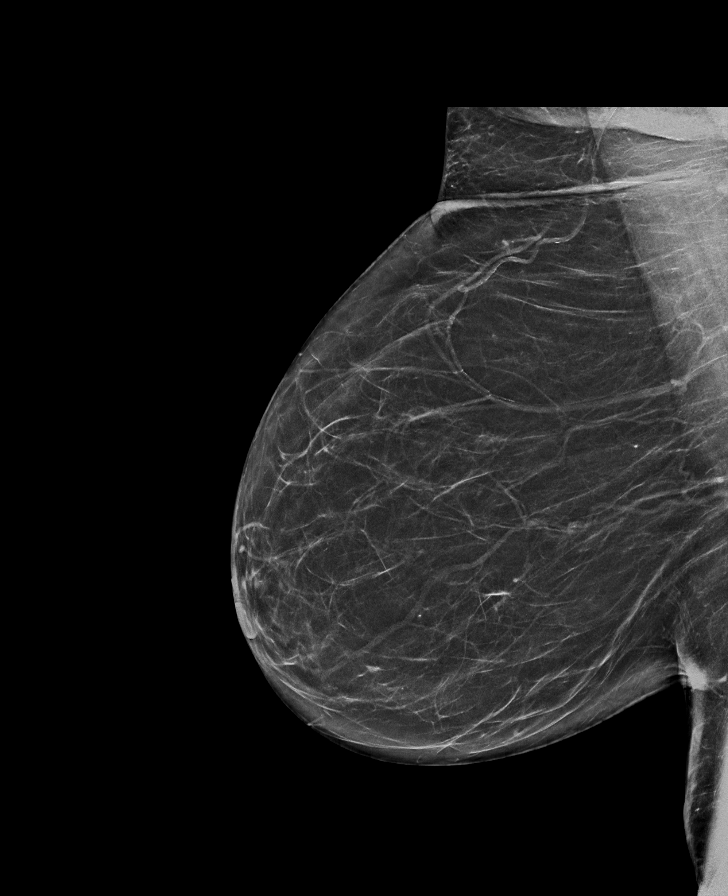

[R MLO tomo · tomo slice 33/65.0]
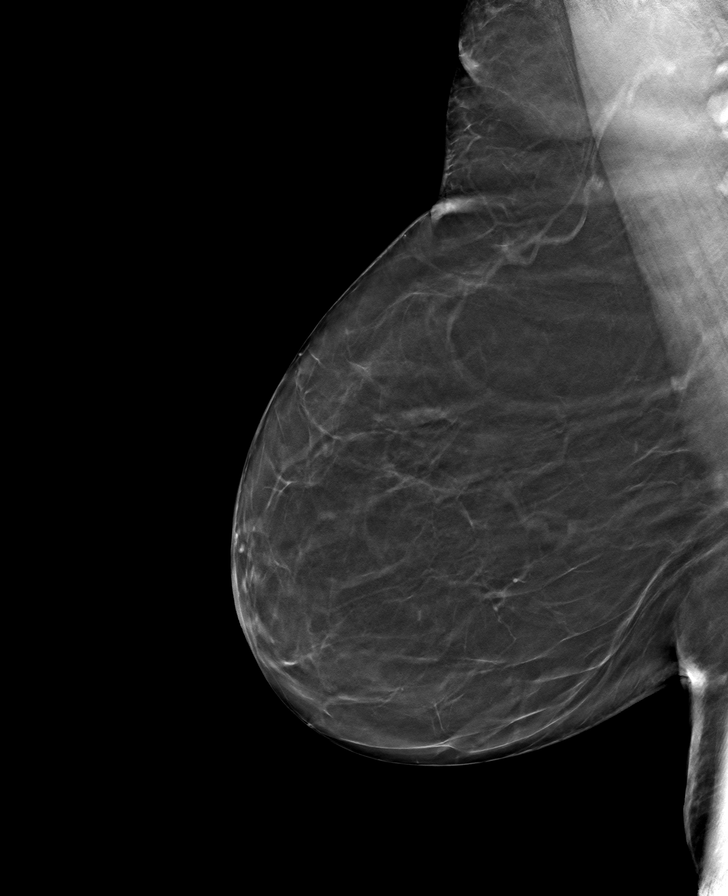

[L MLO tomo · tomo slice 32/63.0]
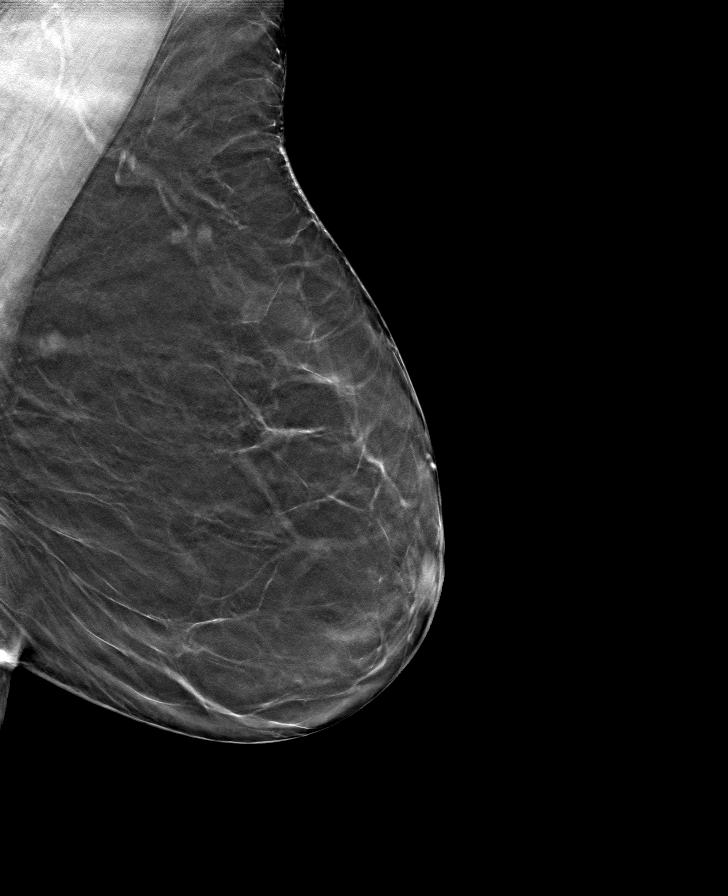

[R CC tomo · tomo slice 27/54.0]
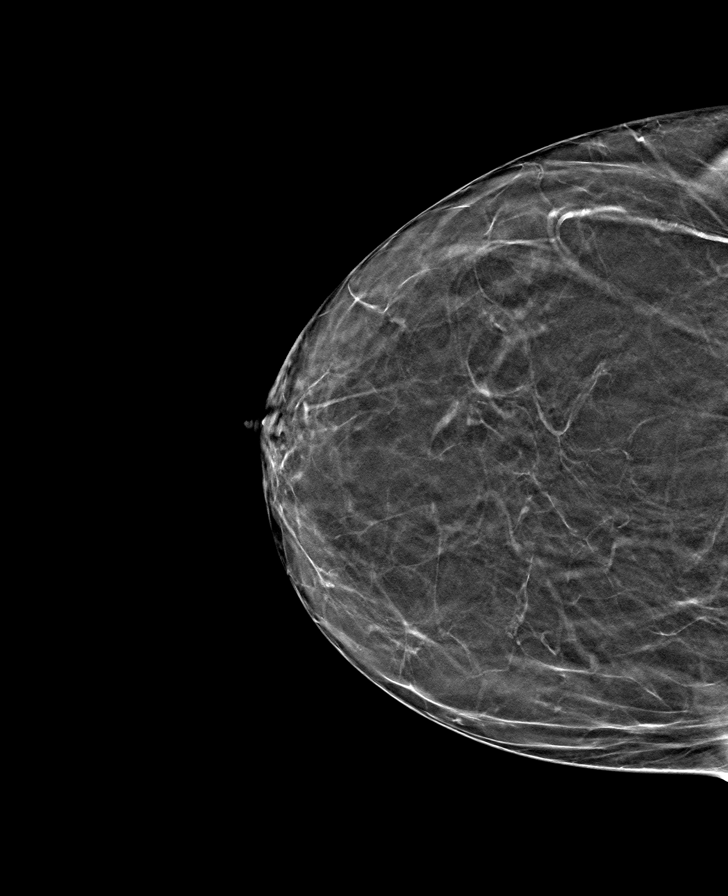

[L CC tomo · tomo slice 27/54.0]
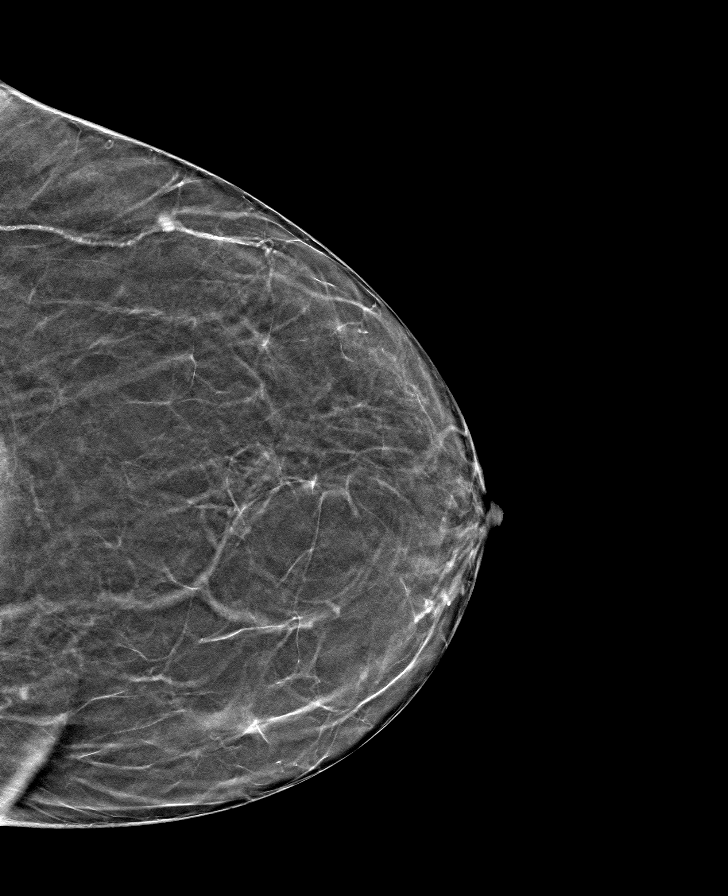

[8 of 24 positions shown; findings below may reference images not displayed]

ACR Breast Density Category b: There are scattered areas of
fibroglandular density.
FINDINGS: There are no findings suspicious for malignancy.
IMPRESSION: No mammographic evidence of malignancy. A result letter of this
screening mammogram will be mailed directly to the patient.

RECOMMENDATION:
Screening mammogram in one year. (Code:51-O-LD2)

BI-RADS CATEGORY  1: Negative.
